# Patient Record
Sex: Male | Born: 1980 | Race: White | Hispanic: No | Marital: Single | Smoking: Current some day smoker
Health system: Southern US, Community
[De-identification: ages and names within clinical notes are randomized; demographics above are authoritative.]

## PROBLEM LIST (undated history)

## (undated) DIAGNOSIS — Z789 Other specified health status: Secondary | ICD-10-CM

## (undated) HISTORY — PX: NO PAST SURGERIES: SHX2092

---

## 2005-10-03 ENCOUNTER — Inpatient Hospital Stay (HOSPITAL_COMMUNITY): Admission: AD | Admit: 2005-10-03 | Discharge: 2005-10-06 | Payer: Self-pay | Admitting: *Deleted

## 2005-10-03 ENCOUNTER — Ambulatory Visit: Payer: Self-pay | Admitting: *Deleted

## 2005-11-19 ENCOUNTER — Ambulatory Visit (HOSPITAL_COMMUNITY): Payer: Self-pay | Admitting: Psychiatry

## 2005-12-22 ENCOUNTER — Ambulatory Visit (HOSPITAL_COMMUNITY): Payer: Self-pay | Admitting: Psychiatry

## 2006-02-05 ENCOUNTER — Ambulatory Visit (HOSPITAL_COMMUNITY): Payer: Self-pay | Admitting: Licensed Clinical Social Worker

## 2006-03-02 ENCOUNTER — Ambulatory Visit (HOSPITAL_COMMUNITY): Payer: Self-pay | Admitting: Psychiatry

## 2013-03-28 ENCOUNTER — Emergency Department (HOSPITAL_COMMUNITY)
Admission: EM | Admit: 2013-03-28 | Discharge: 2013-03-28 | Discharge status: Against Medical Advice | Payer: BC Managed Care – PPO | Attending: Emergency Medicine | Admitting: Emergency Medicine

## 2013-03-28 ENCOUNTER — Ambulatory Visit (HOSPITAL_COMMUNITY)
Admission: AD | Admit: 2013-03-28 | Discharge: 2013-03-28 | Disposition: A | Discharge status: Home / Self Care | Payer: BC Managed Care – PPO | Attending: Psychiatry | Admitting: Psychiatry

## 2013-03-28 ENCOUNTER — Encounter (HOSPITAL_COMMUNITY): Payer: Self-pay | Admitting: Emergency Medicine

## 2013-03-28 ENCOUNTER — Encounter (HOSPITAL_COMMUNITY): Payer: Self-pay | Admitting: Licensed Clinical Social Worker

## 2013-03-28 DIAGNOSIS — F1910 Other psychoactive substance abuse, uncomplicated: Secondary | ICD-10-CM

## 2013-03-28 DIAGNOSIS — F411 Generalized anxiety disorder: Secondary | ICD-10-CM | POA: Insufficient documentation

## 2013-03-28 DIAGNOSIS — F1110 Opioid abuse, uncomplicated: Secondary | ICD-10-CM | POA: Insufficient documentation

## 2013-03-28 DIAGNOSIS — F17200 Nicotine dependence, unspecified, uncomplicated: Secondary | ICD-10-CM | POA: Insufficient documentation

## 2013-03-28 DIAGNOSIS — F1510 Other stimulant abuse, uncomplicated: Secondary | ICD-10-CM | POA: Insufficient documentation

## 2013-03-28 HISTORY — DX: Other specified health status: Z78.9

## 2013-03-28 LAB — COMPREHENSIVE METABOLIC PANEL
AST: 18 U/L (ref 0–37)
Albumin: 4.6 g/dL (ref 3.5–5.2)
Alkaline Phosphatase: 50 U/L (ref 39–117)
Chloride: 102 mEq/L (ref 96–112)
Creatinine, Ser: 0.69 mg/dL (ref 0.50–1.35)
Potassium: 3.6 mEq/L (ref 3.5–5.1)
Total Bilirubin: 0.2 mg/dL — ABNORMAL LOW (ref 0.3–1.2)
Total Protein: 7.9 g/dL (ref 6.0–8.3)

## 2013-03-28 LAB — CBC WITH DIFFERENTIAL/PLATELET
Basophils Absolute: 0 10*3/uL (ref 0.0–0.1)
Basophils Relative: 1 % (ref 0–1)
Eosinophils Relative: 2 % (ref 0–5)
HCT: 48 % (ref 39.0–52.0)
MCHC: 35.4 g/dL (ref 30.0–36.0)
Monocytes Absolute: 0.5 10*3/uL (ref 0.1–1.0)
Neutro Abs: 4.2 10*3/uL (ref 1.7–7.7)
Neutrophils Relative %: 59 % (ref 43–77)
RDW: 12.1 % (ref 11.5–15.5)

## 2013-03-28 LAB — RAPID URINE DRUG SCREEN, HOSP PERFORMED
Amphetamines: POSITIVE — AB
Barbiturates: NOT DETECTED
Benzodiazepines: NOT DETECTED
Cocaine: NOT DETECTED
Opiates: NOT DETECTED
Tetrahydrocannabinol: NOT DETECTED

## 2013-03-28 LAB — SALICYLATE LEVEL: Salicylate Lvl: <2 mg/dL — ABNORMAL LOW (ref 2.8–20.0)
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
## 2013-03-28 NOTE — ED Notes (Signed)
Pt reports that he wants detox from heroin, cocaine, methamphetamines and ETOH.  Pt reports that he has been using these drugs for several years now and he is ready to quit.  Pt was seen at [facility] and needs medical clearance. Denies SI/HI at this time.

## 2013-03-28 NOTE — ED Provider Notes (Signed)
CSN: [id number]     Arrival date & time 03/28/13  2440 History   First MD Initiated Contact with Patient 03/28/13 [phone]     Chief Complaint  Patient presents with  . Medical Clearance   (Consider location/radiation/quality/duration/timing/severity/associated sxs/prior Treatment) HPI Comments: Patient presents requesting detox. He states he drinks all the time. He also admits to using a lot of methamphetamines and snorting heroin. He states he feels like he wants to quit. He [facility] and was sent over here for medical clearance. He currently denies any physical complaints. He denies any withdrawal symptoms. He denies any suicidal or homicidal ideations.   Past Medical History  Diagnosis Date  . Medical history non-contributory    Past Surgical History  Procedure Laterality Date  . No past surgeries     History reviewed. No pertinent family history. History  Substance Use Topics  . Smoking status: Current Every Day Smoker -- 1.00 packs/day    Types: Cigarettes  . Smokeless tobacco: Not on file  . Alcohol Use: 13.2 oz/week    12 Cans of beer, 10 Shots of liquor per week    Review of Systems  Constitutional: Negative for fever, chills, diaphoresis and fatigue.  HENT: Negative for congestion, rhinorrhea and sneezing.   Eyes: Negative.   Respiratory: Negative for cough, chest tightness and shortness of breath.   Cardiovascular: Negative for chest pain and leg swelling.  Gastrointestinal: Negative for nausea, vomiting, abdominal pain, diarrhea and blood in stool.  Genitourinary: Negative for frequency, hematuria, flank pain and difficulty urinating.  Musculoskeletal: Negative for back pain and arthralgias.  Skin: Negative for rash.  Neurological: Negative for dizziness, speech difficulty, weakness, numbness and headaches.  Psychiatric/Behavioral: The patient is nervous/anxious.     Allergies  Review of patient's allergies indicates no known  allergies.  Home Medications  No current outpatient prescriptions on file. BP 146/92  Pulse 119  Temp(Src) 98.3 F (36.8 C) (Oral)  Resp 16  Ht 6' (1.829 m)  Wt 160 lb (72.576 kg)  BMI 21.7 kg/m2  SpO2 98% Physical Exam  Constitutional: He is oriented to person, place, and time. He appears well-developed and well-nourished.  HENT:  Head: Normocephalic and atraumatic.  Eyes: Pupils are equal, round, and reactive to light.  Neck: Normal range of motion. Neck supple.  Cardiovascular: Normal rate, regular rhythm and normal heart sounds.   Pulmonary/Chest: Effort normal and breath sounds normal. No respiratory distress. He has no wheezes. He has no rales. He exhibits no tenderness.  Abdominal: Soft. Bowel sounds are normal. There is no tenderness. There is no rebound and no guarding.  Musculoskeletal: Normal range of motion. He exhibits no edema.  Lymphadenopathy:    He has no cervical adenopathy.  Neurological: He is alert and oriented to person, place, and time.  Skin: Skin is warm and dry. No rash noted.  Psychiatric: He has a normal mood and affect.  Appears slightly anxious.    ED Course  Procedures (including critical care time) Labs Review Results for orders placed during the hospital encounter of 03/28/13  CBC WITH DIFFERENTIAL      Result Value Range   WBC 7.1  4.0 - 10.5 K/uL   RBC 5.16  4.22 - 5.81 MIL/uL   Hemoglobin 17.0  13.0 - 17.0 g/dL   HCT 25.3  66.4 - 40.3 %   MCV 93.0  78.0 - 100.0 fL   MCH 32.9  26.0 - 34.0 pg   MCHC 35.4  30.0 -  36.0 g/dL   RDW 09.8  11.9 - 14.7 %   Platelets 296  150 - 400 K/uL   Neutrophils Relative % 59  43 - 77 %   Neutro Abs 4.2  1.7 - 7.7 K/uL   Lymphocytes Relative 32  12 - 46 %   Lymphs Abs 2.3  0.7 - 4.0 K/uL   Monocytes Relative 7  3 - 12 %   Monocytes Absolute 0.5  0.1 - 1.0 K/uL   Eosinophils Relative 2  0 - 5 %   Eosinophils Absolute 0.2  0.0 - 0.7 K/uL   Basophils Relative 1  0 - 1 %   Basophils Absolute 0.0  0.0 -  0.1 K/uL  COMPREHENSIVE METABOLIC PANEL      Result Value Range   Sodium 141  135 - 145 mEq/L   Potassium 3.6  3.5 - 5.1 mEq/L   Chloride 102  96 - 112 mEq/L   CO2 26  19 - 32 mEq/L   Glucose, Bld 100 (*) 70 - 99 mg/dL   BUN 8  6 - 23 mg/dL   Creatinine, Ser 8.29  0.50 - 1.35 mg/dL   Calcium 9.5  8.4 - 56.2 mg/dL   Total Protein 7.9  6.0 - 8.3 g/dL   Albumin 4.6  3.5 - 5.2 g/dL   AST 18  0 - 37 U/L   ALT 22  0 - 53 U/L   Alkaline Phosphatase 50  39 - 117 U/L   Total Bilirubin 0.2 (*) 0.3 - 1.2 mg/dL   GFR calc non Af Amer >90  >90 mL/min   GFR calc Af Amer >90  >90 mL/min  ETHANOL      Result Value Range   Alcohol, Ethyl (B) 155 (*) 0 - 11 mg/dL  ACETAMINOPHEN LEVEL      Result Value Range   Acetaminophen (Tylenol), Serum <15.0  10 - 30 ug/mL  SALICYLATE LEVEL      Result Value Range   Salicylate Lvl <2.0 (*) 2.8 - 20.0 mg/dL   No results found.   Date: 03/28/2013  Rate: 99  Rhythm: normal sinus rhythm  QRS Axis: normal  Intervals: normal  ST/T Wave abnormalities: normal  Conduction Disutrbances:none  Narrative Interpretation:   Old EKG Reviewed: none available   Imaging Review No results found.  MDM   1. Substance abuse    Will check labs and consult TTS for possible detox placement.  Start CIWA protocol.  Patient ended up leaving AMA prior to TTS evaluation  Rolan Bucco, MD 03/28/13 0630

## 2013-03-28 NOTE — ED Notes (Signed)
Pt stating he does not wish to stay. Dr. Fredderick Phenix made aware and stated pt would have to leave AMA.

## 2013-03-28 NOTE — Consult Note (Signed)
  Results review:Labs  Willliam D.Kuhar acceptable for admission to treatment facility

## 2013-03-28 NOTE — ED Notes (Signed)
Dr. Fredderick Phenix made aware pt left AMA at this time.

## 2013-10-27 ENCOUNTER — Encounter (HOSPITAL_COMMUNITY): Payer: Self-pay | Admitting: Emergency Medicine

## 2013-10-27 ENCOUNTER — Emergency Department (HOSPITAL_COMMUNITY)
Admission: EM | Admit: 2013-10-27 | Discharge: 2013-10-27 | Disposition: A | Discharge status: Home / Self Care | Payer: BC Managed Care – PPO | Attending: Emergency Medicine | Admitting: Emergency Medicine

## 2013-10-27 ENCOUNTER — Emergency Department (HOSPITAL_COMMUNITY): Payer: BC Managed Care – PPO

## 2013-10-27 DIAGNOSIS — T148XXA Other injury of unspecified body region, initial encounter: Secondary | ICD-10-CM | POA: Insufficient documentation

## 2013-10-27 DIAGNOSIS — T07XXXA Unspecified multiple injuries, initial encounter: Secondary | ICD-10-CM

## 2013-10-27 DIAGNOSIS — S40219A Abrasion of unspecified shoulder, initial encounter: Secondary | ICD-10-CM

## 2013-10-27 DIAGNOSIS — S0180XA Unspecified open wound of other part of head, initial encounter: Secondary | ICD-10-CM | POA: Insufficient documentation

## 2013-10-27 DIAGNOSIS — Z792 Long term (current) use of antibiotics: Secondary | ICD-10-CM | POA: Insufficient documentation

## 2013-10-27 DIAGNOSIS — Z791 Long term (current) use of non-steroidal anti-inflammatories (NSAID): Secondary | ICD-10-CM | POA: Insufficient documentation

## 2013-10-27 DIAGNOSIS — S40019A Contusion of unspecified shoulder, initial encounter: Secondary | ICD-10-CM | POA: Insufficient documentation

## 2013-10-27 DIAGNOSIS — S46909A Unspecified injury of unspecified muscle, fascia and tendon at shoulder and upper arm level, unspecified arm, initial encounter: Secondary | ICD-10-CM | POA: Insufficient documentation

## 2013-10-27 DIAGNOSIS — IMO0002 Reserved for concepts with insufficient information to code with codable children: Secondary | ICD-10-CM | POA: Insufficient documentation

## 2013-10-27 DIAGNOSIS — S0190XA Unspecified open wound of unspecified part of head, initial encounter: Secondary | ICD-10-CM | POA: Insufficient documentation

## 2013-10-27 DIAGNOSIS — Z79899 Other long term (current) drug therapy: Secondary | ICD-10-CM | POA: Insufficient documentation

## 2013-10-27 DIAGNOSIS — F17200 Nicotine dependence, unspecified, uncomplicated: Secondary | ICD-10-CM | POA: Insufficient documentation

## 2013-10-27 DIAGNOSIS — S1093XA Contusion of unspecified part of neck, initial encounter: Secondary | ICD-10-CM

## 2013-10-27 DIAGNOSIS — S0083XA Contusion of other part of head, initial encounter: Secondary | ICD-10-CM | POA: Insufficient documentation

## 2013-10-27 DIAGNOSIS — S0003XA Contusion of scalp, initial encounter: Secondary | ICD-10-CM | POA: Insufficient documentation

## 2013-10-27 DIAGNOSIS — S4980XA Other specified injuries of shoulder and upper arm, unspecified arm, initial encounter: Secondary | ICD-10-CM | POA: Insufficient documentation

## 2013-10-27 DIAGNOSIS — S4991XA Unspecified injury of right shoulder and upper arm, initial encounter: Secondary | ICD-10-CM

## 2013-10-27 DIAGNOSIS — Z23 Encounter for immunization: Secondary | ICD-10-CM | POA: Insufficient documentation

## 2013-10-27 MED ORDER — IBUPROFEN 800 MG PO TABS: 800.0000 mg | ORAL_TABLET | Freq: Three times a day (TID) | ORAL | Status: DC

## 2013-10-27 MED ORDER — HYDROCODONE-ACETAMINOPHEN 5-325 MG PO TABS: 1.0000 | ORAL_TABLET | ORAL | Status: DC | PRN

## 2013-10-27 MED ORDER — CEPHALEXIN 500 MG PO CAPS: 500.0000 mg | ORAL_CAPSULE | Freq: Four times a day (QID) | ORAL | Status: DC

## 2013-10-27 MED ORDER — TETANUS-DIPHTH-ACELL PERTUSSIS 5-2.5-18.5 LF-MCG/0.5 IM SUSP
0.5000 mL | Freq: Once | INTRAMUSCULAR | Status: AC
  Administered 2013-10-27: 0.5 mL via INTRAMUSCULAR
  Filled 2013-10-27: qty 0.5

## 2013-10-27 NOTE — Discharge Instructions (Signed)
Call an Orthopedic doctor for further evaluation of your shoulder. Call for a follow up appointment with a Family or Primary Care Provider.  Return if Symptoms worsen.   Take medication as prescribed.  Ice your shoulder and other injuries 3-4 times a day.
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
## 2013-10-27 NOTE — Discharge Instructions (Signed)
Assault, General  Assault includes any behavior, whether intentional or reckless, which results in bodily injury to another person and/or damage to property. Included in this would be any behavior, intentional or reckless, that by its nature would be understood (interpreted) by a reasonable person as intent to harm another person or to damage his/her property. Threats may be oral or written. They may be communicated through regular mail, computer, fax, or phone. These threats may be direct or implied.  FORMS OF ASSAULT INCLUDE:  · Physically assaulting a person. This includes physical threats to inflict physical harm as well as:  · Slapping.  · Hitting.  · Poking.  · Kicking.  · Punching.  · Pushing.  · Arson.  · Sabotage.  · Equipment vandalism.  · Damaging or destroying property.  · Throwing or hitting objects.  · Displaying a weapon or an object that appears to be a weapon in a threatening manner.  · Carrying a firearm of any kind.  · Using a weapon to harm someone.  · Using greater physical size/strength to intimidate another.  · Making intimidating or threatening gestures.  · Bullying.  · Hazing.  · Intimidating, threatening, hostile, or abusive language directed toward another person.  · It communicates the intention to engage in violence against that person. And it leads a reasonable person to expect that violent behavior may occur.  · Stalking another person.  IF IT HAPPENS AGAIN:  · Immediately call for emergency help (911 in [place]  · If someone poses clear and immediate danger to you, seek legal authorities to have a protective or restraining order put in place.  · Less threatening assaults can at least be reported to authorities.  STEPS TO TAKE IF A SEXUAL ASSAULT HAS HAPPENED  · Go to an area of safety. This may include a shelter or staying with a friend. Stay away from the area where you have been attacked. A large percentage of sexual assaults are caused by a friend, relative or associate.  · If  medications were given by your caregiver, take them as directed for the full length of time prescribed.  · Only take over-the-counter or prescription medicines for pain, discomfort, or fever as directed by your caregiver.  · If you have come in contact with a sexual disease, find out if you are to be tested again. If your caregiver is concerned about the HIV/AIDS virus, he/she may require you to have continued testing for several months.  · For the protection of your privacy, test results can not be given over the phone. Make sure you receive the results of your test. If your test results are not back during your visit, make an appointment with your caregiver to find out the results. Do not assume everything is normal if you have not heard from your caregiver or the medical facility. It is important for you to follow up on all of your test results.  · File appropriate papers with authorities. This is important in all assaults, even if it has occurred in a family or by a friend.  SEEK MEDICAL CARE IF:  · You have new problems because of your injuries.  · You have problems that may be because of the medicine you are taking, such as:  · Rash.  · Itching.  · Swelling.  · Trouble breathing.  · You develop belly (abdominal) pain, feel sick to your stomach (nausea) or are vomiting.  · You begin to run a temperature.  · You   need supportive care or referral to a rape crisis center. These are centers with trained personnel who can help you get through this ordeal.  SEEK IMMEDIATE MEDICAL CARE IF:  · You are afraid of being threatened, beaten, or abused. In [place] call 911.  · You receive new injuries related to abuse.  · You develop severe pain in any area injured in the assault or have any change in your condition that concerns you.  · You faint or lose consciousness.  · You develop chest pain or shortness of breath.  Document Released: 06/09/2005 Document Revised: 09/01/2011 Document Reviewed: 01/26/2008  Exit[employer]® Patient  Information ©2014 [employer].

## 2013-10-27 NOTE — ED Notes (Signed)
PA at the bedside.
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
## 2013-10-28 NOTE — ED Provider Notes (Signed)
Medical screening examination/treatment/procedure(s) were performed by non-physician practitioner and as supervising physician I was immediately available for consultation/collaboration.   EKG Interpretation None        Tykeisha Peer M Ethridge Sollenberger, DO 10/28/13 2111 

## 2013-11-01 ENCOUNTER — Encounter (HOSPITAL_COMMUNITY): Payer: Self-pay | Admitting: Emergency Medicine

## 2013-11-01 ENCOUNTER — Emergency Department (INDEPENDENT_AMBULATORY_CARE_PROVIDER_SITE_OTHER)
Admission: EM | Admit: 2013-11-01 | Discharge: 2013-11-01 | Disposition: A | Discharge status: Home / Self Care | Payer: Self-pay | Source: Home / Self Care | Attending: Family Medicine | Admitting: Family Medicine

## 2013-11-01 DIAGNOSIS — Z4802 Encounter for removal of sutures: Secondary | ICD-10-CM

## 2013-11-01 NOTE — Discharge Instructions (Signed)
Care as discussed, return for suture removal.

## 2013-11-01 NOTE — ED Provider Notes (Signed)
CSN: [id number]     Arrival date & time 11/01/13  0831 History   First MD Initiated Contact with Patient 11/01/13 0915     Chief Complaint  Patient presents with  . Facial Laceration   (Consider location/radiation/quality/duration/timing/severity/associated sxs/prior Treatment) Patient is a 33 y.o. male presenting with suture removal. The history is provided by the patient.  Suture / Staple Removal This is a new problem. The current episode started more than 2 days ago (right eyebrow and chin sutured 5/7 in ER, thick eschar over both wounds present.).    Past Medical History  Diagnosis Date  . Medical history non-contributory    Past Surgical History  Procedure Laterality Date  . No past surgeries     History reviewed. No pertinent family history. History  Substance Use Topics  . Smoking status: Current Every Day Smoker -- 1.00 packs/day    Types: Cigarettes  . Smokeless tobacco: Not on file  . Alcohol Use: 13.2 oz/week    12 Cans of beer, 10 Shots of liquor per week    Review of Systems  Constitutional: Negative.   Skin: Positive for wound.    Allergies  Review of patient's allergies indicates no known allergies.  Home Medications   Prior to Admission medications   Medication Sig Start Date End Date Taking? Authorizing Provider  esomeprazole (NEXIUM) 40 MG capsule Take 40 mg by mouth daily at 12 noon.    Historical Provider, MD  HYDROcodone-acetaminophen (NORCO/VICODIN) 5-325 MG per tablet Take 1 tablet by mouth every 4 (four) hours as needed. 10/27/13   Lauren Doretha ImusM Parker, PA-C   BP 132/88  Pulse 102  Temp(Src) 98.5 F (36.9 C) (Oral)  Resp 16  SpO2 98% Physical Exam  Nursing note and vitals reviewed. Constitutional: He is oriented to person, place, and time. He appears well-developed and well-nourished.  Neurological: He is alert and oriented to person, place, and time.  Skin: Skin is warm and dry.  sutres removed from chin lac, bacitracin oint applied, unable  to remove eyebrow sutures b/o eschar.    ED Course  Procedures (including critical care time) Labs Review Labs Reviewed - No data to display  Imaging Review No results found.   MDM   1. Encounter for removal of sutures    Eschar needs to be removed before sutures removed or wound will separate at this time.    Linna HoffJames D Kindl, MD 11/01/13 [phone]

## 2013-11-01 NOTE — ED Notes (Signed)
Pt   Reports     He  Is  Here  For  Suture  Removal on  His  Face   Sutures  Have  Been in place  For    5  Days

## 2013-11-04 ENCOUNTER — Encounter (HOSPITAL_COMMUNITY): Payer: Self-pay | Admitting: Emergency Medicine

## 2013-11-04 ENCOUNTER — Emergency Department (INDEPENDENT_AMBULATORY_CARE_PROVIDER_SITE_OTHER)
Admission: EM | Admit: 2013-11-04 | Discharge: 2013-11-04 | Disposition: A | Discharge status: Home / Self Care | Payer: Self-pay | Source: Home / Self Care

## 2013-11-04 DIAGNOSIS — Z4802 Encounter for removal of sutures: Secondary | ICD-10-CM

## 2013-11-04 NOTE — ED Provider Notes (Signed)
CSN: [id number]     Arrival date & time 11/04/13  [id number] History   First MD Initiated Contact with Patient 11/04/13 [phone]     Chief Complaint  Patient presents with  . Suture / Staple Removal   (Consider location/radiation/quality/duration/timing/severity/associated sxs/prior Treatment) HPI Comments: For suture removal of laceration repaired in ED. Located above R eye.    Past Medical History  Diagnosis Date  . Medical history non-contributory    Past Surgical History  Procedure Laterality Date  . No past surgeries     History reviewed. No pertinent family history. History  Substance Use Topics  . Smoking status: Current Every Day Smoker -- 1.00 packs/day    Types: Cigarettes  . Smokeless tobacco: Not on file  . Alcohol Use: 13.2 oz/week    12 Cans of beer, 10 Shots of liquor per week    Review of Systems  All other systems reviewed and are negative.   Allergies  Review of patient's allergies indicates no known allergies.  Home Medications   Prior to Admission medications   Medication Sig Start Date End Date Taking? Authorizing Provider  esomeprazole (NEXIUM) 40 MG capsule Take 40 mg by mouth daily at 12 noon.    Historical Provider, MD  HYDROcodone-acetaminophen (NORCO/VICODIN) 5-325 MG per tablet Take 1 tablet by mouth every 4 (four) hours as needed. 10/27/13   Lauren Doretha ImusM Parker, PA-C   BP 134/86  Pulse 74  Temp(Src) 98.4 F (36.9 C) (Oral)  Resp 12  SpO2 100% Physical Exam  Nursing note and vitals reviewed. Constitutional: He is oriented to person, place, and time. He appears well-developed and well-nourished. No distress.  Neurological: He is alert and oriented to person, place, and time.  Skin: Skin is warm and dry.  Wound healing well. Edges well approximated. No signs of infection    ED Course  SUTURE REMOVAL Date/Time: 11/04/2013 9:21 AM Performed by: Phineas RealMABE, Saralyn Willison Authorized by: Bradd CanaryKINDL, JAMES D Consent: Verbal consent obtained. Risks and benefits: risks,  benefits and alternatives were discussed Consent given by: patient Patient understanding: patient states understanding of the procedure being performed Patient identity confirmed: verbally with patient Body area: head/neck Location details: right eyebrow Wound Appearance: clean Sutures Removed: 6 Patient tolerance: Patient tolerated the procedure well with no immediate complications.   (including critical care time) Labs Review Labs Reviewed - No data to display  Imaging Review No results found.   MDM   1. Visit for suture removal     All sutures removed. Mild puffiness but no erythema or signs of infection.  Return if needed.    Hayden Rasmussenavid Jazilyn Siegenthaler, NP 11/04/13 [phone]

## 2013-11-04 NOTE — ED Provider Notes (Signed)
Medical screening examination/treatment/procedure(s) were performed by resident physician or non-physician practitioner and as supervising physician I was immediately available for consultation/collaboration.   Nattie Lazenby DOUGLAS MD.   Hazely Sealey D Nadra Hritz, MD 11/04/13 1040 

## 2013-11-04 NOTE — ED Notes (Signed)
Suture removal from right brow.  Pt voices no concerns.

## 2013-11-04 NOTE — Discharge Instructions (Signed)
Suture Removal, Care After Refer to this sheet in the next few weeks. These instructions provide you with information on caring for yourself after your procedure. Your health care provider may also give you more specific instructions. Your treatment has been planned according to current medical practices, but problems sometimes occur. Call your health care provider if you have any problems or questions after your procedure. WHAT TO EXPECT AFTER THE PROCEDURE After your stitches (sutures) are removed, it is typical to have the following:  Some discomfort and swelling in the wound area.  Slight redness in the area. HOME CARE INSTRUCTIONS   If you have skin adhesive strips over the wound area, do not take the strips off. They will fall off on their own in a few days. If the strips remain in place after 14 days, you may remove them.  Change any bandages (dressings) at least once a day or as directed by your health care provider. If the bandage sticks, soak it off with warm, soapy water.  Apply cream or ointment only as directed by your health care provider. If using cream or ointment, wash the area with soap and water 2 times a day to remove all the cream or ointment. Rinse off the soap and pat the area dry with a clean towel.  Keep the wound area dry and clean. If the bandage becomes wet or dirty, or if it develops a bad smell, change it as soon as possible.  Continue to protect the wound from injury.  Use sunscreen when out in the sun. New scars become sunburned easily. SEEK MEDICAL CARE IF:  You have increasing redness, swelling, or pain in the wound.  You see pus coming from the wound.  You have a fever.  You notice a bad smell coming from the wound or dressing.  Your wound breaks open (edges not staying together). Document Released: 03/04/2001 Document Revised: 03/30/2013 Document Reviewed: 01/19/2013 [employer] Patient Information 2014 [employer].  

## 2014-01-16 ENCOUNTER — Encounter (HOSPITAL_COMMUNITY): Payer: Self-pay | Admitting: Emergency Medicine

## 2014-01-16 ENCOUNTER — Emergency Department (HOSPITAL_COMMUNITY)
Admission: EM | Admit: 2014-01-16 | Discharge: 2014-01-17 | Discharge status: Against Medical Advice | Payer: Self-pay | Attending: Emergency Medicine | Admitting: Emergency Medicine

## 2014-01-16 DIAGNOSIS — S61012A Laceration without foreign body of left thumb without damage to nail, initial encounter: Secondary | ICD-10-CM

## 2014-01-16 DIAGNOSIS — W292XXA Contact with other powered household machinery, initial encounter: Secondary | ICD-10-CM | POA: Insufficient documentation

## 2014-01-16 DIAGNOSIS — Y9389 Activity, other specified: Secondary | ICD-10-CM | POA: Insufficient documentation

## 2014-01-16 DIAGNOSIS — S61209A Unspecified open wound of unspecified finger without damage to nail, initial encounter: Secondary | ICD-10-CM | POA: Insufficient documentation

## 2014-01-16 DIAGNOSIS — F17200 Nicotine dependence, unspecified, uncomplicated: Secondary | ICD-10-CM | POA: Insufficient documentation

## 2014-01-16 DIAGNOSIS — Z79899 Other long term (current) drug therapy: Secondary | ICD-10-CM | POA: Insufficient documentation

## 2014-01-16 DIAGNOSIS — Z792 Long term (current) use of antibiotics: Secondary | ICD-10-CM | POA: Insufficient documentation

## 2014-01-16 DIAGNOSIS — Y9289 Other specified places as the place of occurrence of the external cause: Secondary | ICD-10-CM | POA: Insufficient documentation

## 2014-01-16 NOTE — ED Provider Notes (Signed)
CSN: [id number]     Arrival date & time 01/16/14  2217 History  This chart was scribed for non-physician provider Dierdre Forth, PA-C, working with Marisa Severin, MD by Phillis Haggis, ED Scribe. This patient was seen in room TR07C/TR07C and patient care was started at 11:07 PM.    Chief Complaint  Patient presents with  . 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  He said the knife slipped and it caught his thumb on his left hand.    The history is provided by the patient and medical records. No language interpreter was used.   HPI Comments: Justin Daniel is a 33 y.o. male who presents to the Emergency Department complaining of a left thumb laceration onset. 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 He states that he has had stitches before. He states that there was a lot of blood upon the start of the incident, but bleeding is now controlled. He reports that he has had a tdap in the last 6 months. He reports that he is unable to bend his thumb, but denies numbness and tingling.  Past Medical History  Diagnosis Date  . Medical history non-contributory    Past Surgical History  Procedure Laterality Date  . No past surgeries     History reviewed. No pertinent family history. History  Substance Use Topics  . Smoking status: Current Every Day Smoker -- 1.00 packs/day    Types: Cigarettes  . Smokeless tobacco: Not on file  . Alcohol Use: 13.2 oz/week    12 Cans of beer, 10 Shots of liquor per week    Review of Systems  Constitutional: Negative for fever.  Gastrointestinal: Negative for nausea and vomiting.  Skin: Positive for wound.  Allergic/Immunologic: Negative for immunocompromised state.  Neurological: Negative for weakness and numbness.  Hematological: Does not bruise/bleed easily.  Psychiatric/Behavioral: The patient is not nervous/anxious.     Allergies  Review of patient's allergies indicates no known allergies.  Home Medications   Prior to Admission medications   Medication Sig Start Date End Date Taking? Authorizing Provider  esomeprazole (NEXIUM) 40 MG capsule Take 40 mg by mouth daily at 12 noon.   Yes Historical Provider, MD  naphazoline-glycerin (CLEAR EYES) 0.012-0.2 % SOLN Place 1-2 drops into both eyes every 4 (four) hours as needed for irritation.   Yes Historical Provider, MD  cephALEXin (KEFLEX) 500 MG capsule Take 1 capsule (500 mg total) by mouth 4 (four) times daily. 01/17/14   Princella Jaskiewicz, PA-C   BP 136/90  Pulse 115  Temp(Src) 98.2 F (36.8 C) (Oral)  Resp 18  Ht 6' (1.829 m)  Wt 155 lb (70.308 kg)  BMI 21.02 kg/m2  SpO2 97% Physical Exam  Nursing note and vitals reviewed. Constitutional: He is oriented to person, place, and time. He appears well-developed and well-nourished. No distress.  HENT:  Head: Normocephalic and atraumatic.  Eyes: Conjunctivae are normal. No scleral icterus.  Neck: Normal range of motion.  Cardiovascular: Normal rate, regular rhythm, normal heart sounds and intact distal pulses.   No murmur heard. Capillary refill < 3 sec in the thumb  Pulmonary/Chest: Effort normal and breath sounds normal. No respiratory distress.  Musculoskeletal: Normal range of motion. He exhibits no edema.  ROM: full extension but decreased passive and active flexion Pt with significant pain to palpation of the  proximal phalange of the left thumb Crepitus of the proximal phalange with movement  Neurological: He is alert and oriented to person, place, and time.  Sensation: intact to sharp in the radial and ulnar distributions Strength: 3/5 with extension of the thumb; 1/5 with flexion  Skin: Skin is warm and dry. He is not diaphoretic.  3.5cm laceration to the dorsal and lateral aspect of the thumb  Psychiatric: He has a normal mood and affect.    ED Course  LACERATION REPAIR Date/Time:  01/17/2014 12:07 AM Performed by: Dierdre ForthMUTHERSBAUGH, Meeya Goldin Authorized by: Dierdre ForthMUTHERSBAUGH, Sheri Gatchel Consent: Verbal consent obtained. Risks and benefits: risks, benefits and alternatives were discussed Consent given by: patient Patient understanding: patient states understanding of the procedure being performed Patient consent: the patient's understanding of the procedure matches consent given Procedure consent: procedure consent matches procedure scheduled Relevant documents: relevant documents present and verified Site marked: the operative site was marked Required items: required blood products, implants, devices, and special equipment available Patient identity confirmed: verbally with patient and arm band Time out: Immediately prior to procedure a "time out" was called to verify the correct patient, procedure, equipment, support staff and site/side marked as required. Body area: upper extremity Location details: left thumb Laceration length: 3.5 cm Foreign bodies: no foreign bodies Tendon involvement: unknown. Nerve involvement: none Vascular damage: yes Anesthesia: digital block Local anesthetic: lidocaine 2% without epinephrine Anesthetic total: 5 ml Patient sedated: no Preparation: Patient was prepped and draped in the usual sterile fashion. Irrigation solution: saline Irrigation method: syringe Amount of cleaning: extensive Debridement: none Degree of undermining: none Skin closure: 4-0 Prolene Number of sutures: 4 Technique: simple Approximation: close Approximation difficulty: simple Dressing: 4x4 sterile gauze Patient tolerance: Patient tolerated the procedure well with no immediate complications.   (including critical care time) DIAGNOSTIC STUDIES: Oxygen Saturation is 97% on room air, normal by my interpretation.    COORDINATION OF CARE: 11:08 PM-Discussed treatment plan which includes nerve block for further evaluation  11:41 PM- Discussed treatment plan which  includes hand x-ray with pt at bedside Pt reports that he will not consent to x-ray until the stitches are placed.      Labs Review Labs Reviewed - No data to display  Imaging Review No results found.   EKG Interpretation None      MDM   Final diagnoses:  Laceration of thumb, left, complicated, initial encounter   Justin GammaWilliam D Daniel presents with laceration to the left thumb with a butcher knife. Pt with crepitus to the left thumb and significantly restricted flexion leaving concern for possible fracture or tendon damage.  Discussed plan with patient which included x-ray, possible hand consult and sutures.  He reports that he is unwilling to proceed with further evaluation until his sutures are placed.  Will suture wound against my advice and obtain x-ray.    12:13 AM Pt is now refusing his x-ray.  I have discussed at length my concerns about the possibility of a fracture in addition to the possibility of tendon damage.  He reports that he understands this and understands that without a full evaluation of his thumb in the ED, I cannot guarantee that he will regain the use of his thumb.    I have requested that he follow-up with Dr. Mina MarbleWeingold for further evaluation and treatment of the thumb.  As I am suspicious of a fracture, we will splint the thumb and pt will leave AMA.    The patient was discussed with Dr. Gerhard Munchobert Lockwood who  agrees with the treatment plan.   Date: 01/17/2014 Patient: Justin Daniel Admitted: 01/16/2014 10:23 PM Attending Provider: Gerhard Munch, MD  Justin Daniel or his authorized caregiver has made the decision for the patient to leave the emergency department against the advice of [facility], PA-C.  He or his authorized caregiver has been informed and understands the inherent risks, including death.  He or his authorized caregiver has decided to accept the responsibility for this decision. Justin Daniel and all necessary parties have been  advised that he may return for further evaluation or treatment. His condition at time of discharge was Stable.  Justin Daniel had current vital signs as follows:  Blood pressure 136/90, pulse 115, temperature 98.2 F (36.8 C), temperature source Oral, resp. rate 18, height 6' (1.829 m), weight 155 lb (70.308 kg), SpO2 97.00%.   Justin Daniel or his authorized caregiver has signed the Leaving Against Medical Advice form prior to leaving the department.  Rucha Wissinger, Dahlia Client 01/17/2014 12:19 AM  I personally performed the services described in this documentation, which was scribed in my presence. The recorded information has been reviewed and is accurate.      Dahlia Client Michiel Sivley, PA-C 01/17/14 0020

## 2014-01-16 NOTE — ED Notes (Signed)
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  He said the knife slipped and it caught his thumb on his left hand.  He rates his pain 2/10.  Bleeding is controlled.

## 2014-01-17 MED ORDER — CEPHALEXIN 500 MG PO CAPS
500.0000 mg | ORAL_CAPSULE | Freq: Four times a day (QID) | ORAL | Status: DC
Start: 2014-01-17 — End: 2015-01-20

## 2014-01-17 NOTE — ED Provider Notes (Signed)
  Medical screening examination/treatment/procedure(s) were performed by non-physician practitioner and as supervising physician I was immediately available for consultation/collaboration.   EKG Interpretation None         Gerhard Munchobert Kalan Yeley, MD 01/17/14 [phone]

## 2014-01-17 NOTE — ED Notes (Signed)
Patient left with all personal belongings and was refusing to have vitals taken.

## 2014-01-17 NOTE — Discharge Instructions (Signed)
1. Medications: keflex, usual home medications 2. Treatment: rest, drink plenty of fluids, keep wound clean with warm soap and water 3. Follow Up: Please followup with Dr. Mina MarbleWeingold this week as able for wound check; have sutures removed in 7 days    Sutured Wound Care Sutures are stitches that can be used to close wounds. Wound care helps prevent pain and infection.  HOME CARE INSTRUCTIONS   Rest and elevate the injured area until all the pain and swelling are gone.  Only take over-the-counter or prescription medicines for pain, discomfort, or fever as directed by your caregiver.  After 48 hours, gently wash the area with mild soap and water once a day, or as directed. Rinse off the soap. Pat the area dry with a clean towel. Do not rub the wound. This may cause bleeding.  Follow your caregiver's instructions for how often to change the bandage (dressing). Stop using a dressing after 2 days or after the wound stops draining.  If the dressing sticks, moisten it with soapy water and gently remove it.  Apply ointment on the wound as directed.  Avoid stretching a sutured wound.  Drink enough fluids to keep your urine clear or pale yellow.  Follow up with your caregiver for suture removal as directed.  Use sunscreen on your wound for the next 3 to 6 months so the scar will not darken. SEEK IMMEDIATE MEDICAL CARE IF:   Your wound becomes red, swollen, hot, or tender.  You have increasing pain in the wound.  You have a red streak that extends from the wound.  There is pus coming from the wound.  You have a fever.  You have shaking chills.  There is a bad smell coming from the wound.  You have persistent bleeding from the wound. MAKE SURE YOU:   Understand these instructions.  Will watch your condition.  Will get help right away if you are not doing well or get worse. Document Released: 07/17/2004 Document Revised: 09/01/2011 Document Reviewed: 10/13/2010 [facility] Patient  Information 2015 [employer] [place]. This information is not intended to replace advice given to you by your health care provider. Make sure you discuss any questions you have with your health care provider.

## 2014-08-28 IMAGING — CR DG SHOULDER 2+V*R*
2 series · 2 of 2 positions shown · non-contrast
Comparison: None.

CLINICAL DATA: 32-year-old male status post injury with pain.
Initial encounter.

EXAM:
RIGHT SHOULDER - 2+ VIEW

[w shoulder ap internal righ]
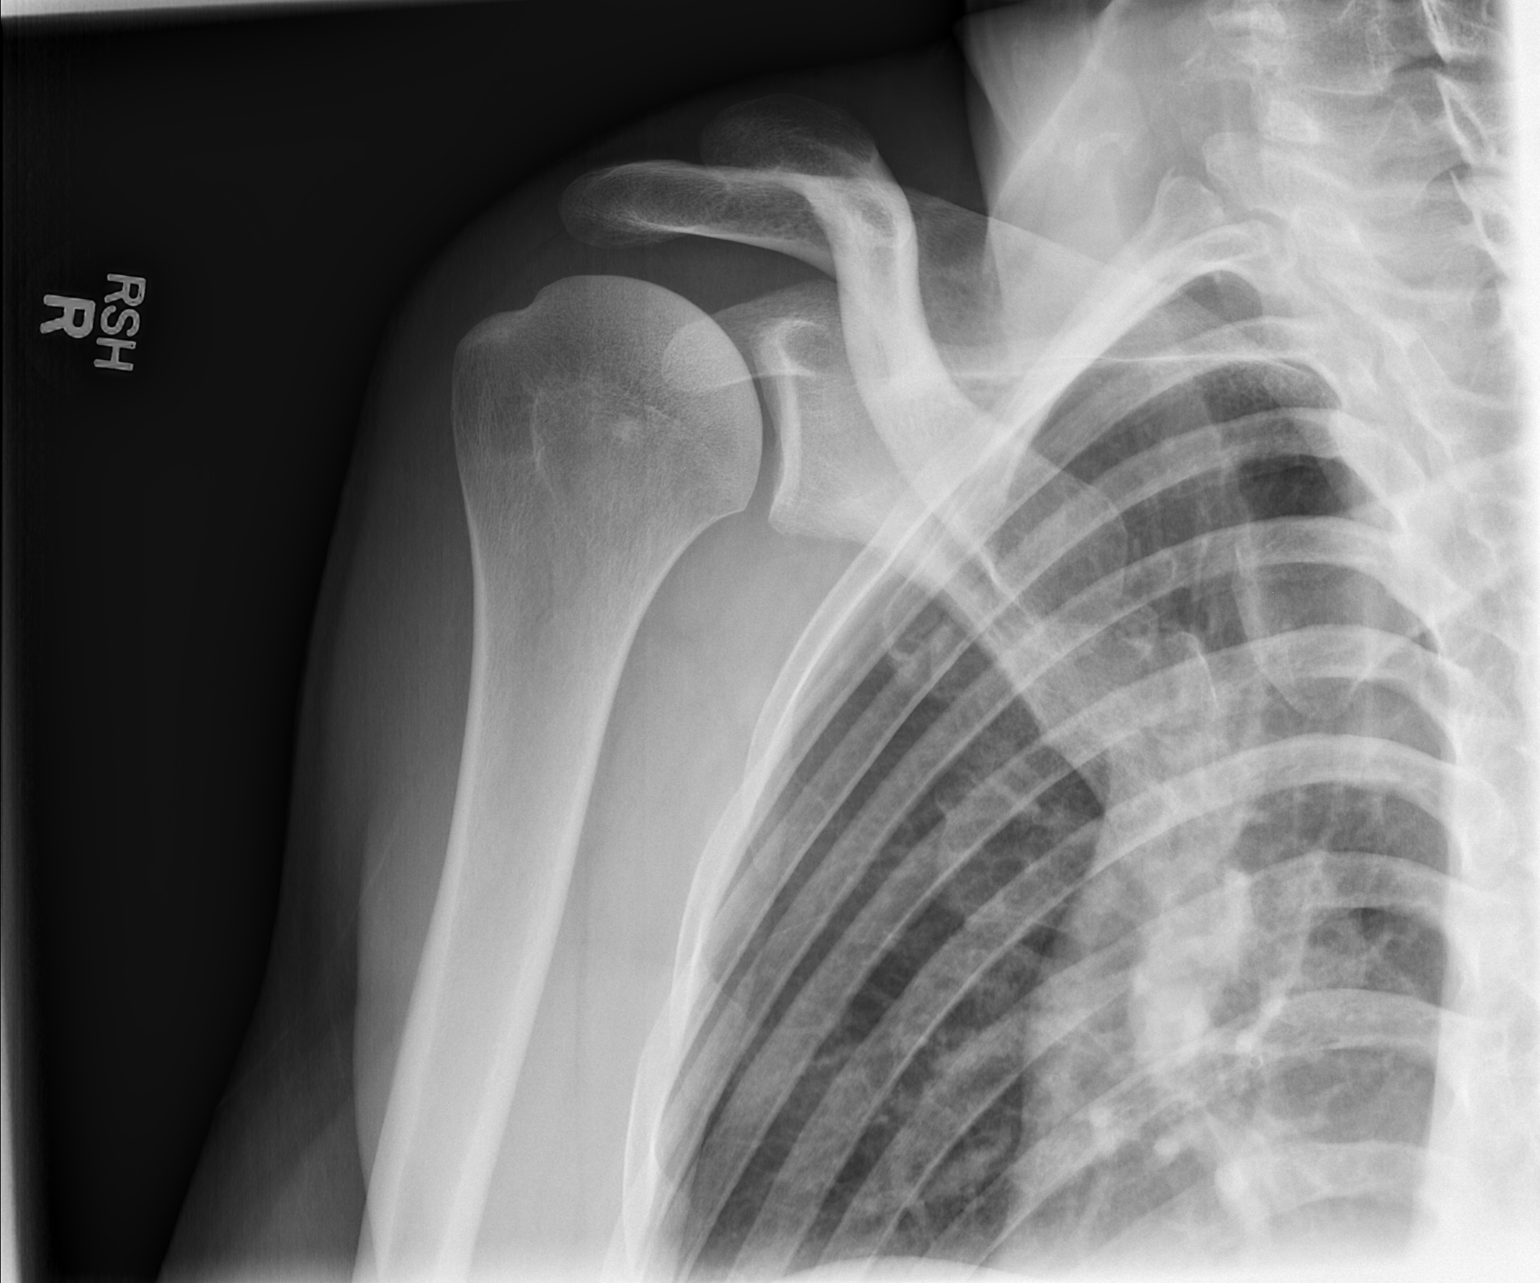

[w shoulder y view right]
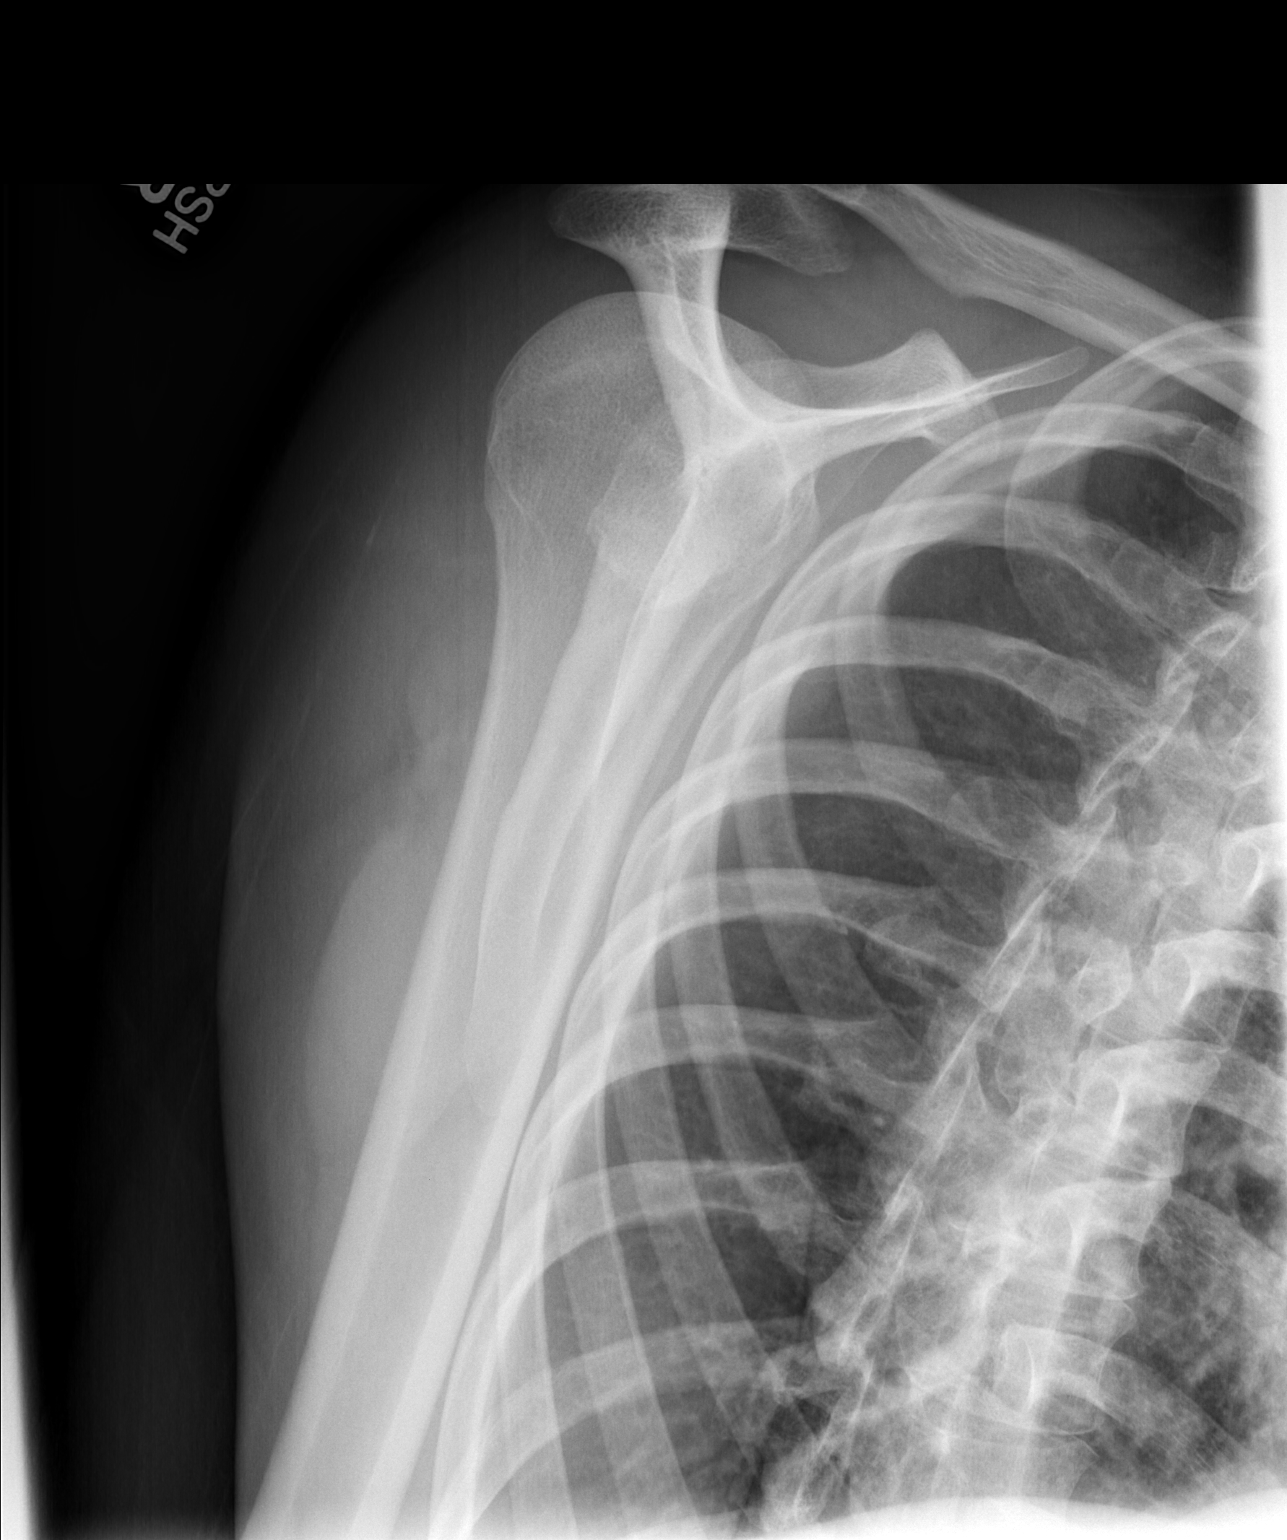

[2 of 2 positions shown; findings below may reference images not displayed]

FINDINGS: Two views of the right shoulder. Bone mineralization is within
normal limits. No glenohumeral joint dislocation. Right clavicle,
proximal humerus and scapula appear intact. Negative visualized
right ribs and lung parenchyma.
IMPRESSION: Negative.

## 2014-10-03 ENCOUNTER — Encounter (HOSPITAL_COMMUNITY): Payer: Self-pay | Admitting: Emergency Medicine

## 2014-10-03 ENCOUNTER — Emergency Department (HOSPITAL_COMMUNITY): Payer: Self-pay

## 2014-10-03 ENCOUNTER — Emergency Department (HOSPITAL_COMMUNITY)
Admission: EM | Admit: 2014-10-03 | Discharge: 2014-10-03 | Disposition: A | Discharge status: Home / Self Care | Payer: Self-pay | Attending: Emergency Medicine | Admitting: Emergency Medicine

## 2014-10-03 DIAGNOSIS — Z720 Tobacco use: Secondary | ICD-10-CM | POA: Insufficient documentation

## 2014-10-03 DIAGNOSIS — G8921 Chronic pain due to trauma: Secondary | ICD-10-CM | POA: Insufficient documentation

## 2014-10-03 DIAGNOSIS — M25511 Pain in right shoulder: Secondary | ICD-10-CM | POA: Insufficient documentation

## 2014-10-03 DIAGNOSIS — Z792 Long term (current) use of antibiotics: Secondary | ICD-10-CM | POA: Insufficient documentation

## 2014-10-03 MED ORDER — TRAMADOL HCL 50 MG PO TABS: 50.0000 mg | ORAL_TABLET | Freq: Four times a day (QID) | ORAL | Status: AC | PRN
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
## 2014-10-03 NOTE — ED Notes (Signed)
Patient transported to X-ray 

## 2014-10-03 NOTE — Discharge Instructions (Signed)
Shoulder Pain °The shoulder is the joint that connects your arms to your body. The bones that form the shoulder joint include the upper arm bone (humerus), the shoulder blade (scapula), and the collarbone (clavicle). The top of the humerus is shaped like a ball and fits into a rather flat socket on the scapula (glenoid cavity). A combination of muscles and strong, fibrous tissues that connect muscles to bones (tendons) support your shoulder joint and hold the ball in the socket. Small, fluid-filled sacs (bursae) are located in different areas of the joint. They act as cushions between the bones and the overlying soft tissues and help reduce friction between the gliding tendons and the bone as you move your arm. Your shoulder joint allows a wide range of motion in your arm. This range of motion allows you to do things like scratch your back or throw a ball. However, this range of motion also makes your shoulder more prone to pain from overuse and injury. °Causes of shoulder pain can originate from both injury and overuse and usually can be grouped in the following four categories: °· Redness, swelling, and pain (inflammation) of the tendon (tendinitis) or the bursae (bursitis). °· Instability, such as a dislocation of the joint. °· Inflammation of the joint (arthritis). °· Broken bone (fracture). °HOME CARE INSTRUCTIONS  °· Apply ice to the sore area. °· Put ice in a plastic bag. °· Place a towel between your skin and the bag. °· Leave the ice on for 15-20 minutes, 3-4 times per day for the first 2 days, or as directed by your health care provider. °· Stop using cold packs if they do not help with the pain. °· If you have a shoulder sling or immobilizer, wear it as long as your caregiver instructs. Only remove it to shower or bathe. Move your arm as little as possible, but keep your hand moving to prevent swelling. °· Squeeze a soft ball or foam pad as much as possible to help prevent swelling. °· Only take  over-the-counter or prescription medicines for pain, discomfort, or fever as directed by your caregiver. °SEEK MEDICAL CARE IF:  °· Your shoulder pain increases, or new pain develops in your arm, hand, or fingers. °· Your hand or fingers become cold and numb. °· Your pain is not relieved with medicines. °SEEK IMMEDIATE MEDICAL CARE IF:  °· Your arm, hand, or fingers are numb or tingling. °· Your arm, hand, or fingers are significantly swollen or turn white or blue. °MAKE SURE YOU:  °· Understand these instructions. °· Will watch your condition. °· Will get help right away if you are not doing well or get worse. °Document Released: 03/19/2005 Document Revised: 10/24/2013 Document Reviewed: 05/24/2011 °Exit[employer]® Patient Information ©2015 [employer]. This information is not intended to replace advice given to you by your health care provider. Make sure you discuss any questions you have with your health care provider. °Shoulder Range of Motion Exercises °The shoulder is the most flexible joint in the human body. Because of this it is also the most unstable joint in the body. All ages can develop shoulder problems. Early treatment of problems is necessary for a good outcome. People react to shoulder pain by decreasing the movement of the joint. After a brief period of time, the shoulder can become "frozen". This is an almost complete loss of the ability to move the damaged shoulder. Following injuries your caregivers can give you instructions on exercises to keep your range of motion (ability to   move your shoulder freely), or regain it if it has been lost.  °EXERCISES °EXERCISES TO MAINTAIN THE MOBILITY OF YOUR SHOULDER: °Codman's Exercise or Pendulum Exercise °· This exercise may be performed in a prone (face-down) lying position or standing while leaning on a chair with the opposite arm. Its purpose is to relax the muscles in your shoulder and slowly but surely increase the range of motion and to relieve  pain. °¨ Lie on your stomach close to the side edge of the bed. Let your weak arm hang over the edge of the bed. Relax your shoulder, arm and hand. Let your shoulder blade relax and drop down. °¨ Slowly and gently swing your arm forward and back. Do not use your neck muscles; relax them. It might be easier to have someone else gently start swinging your arm. °¨ As pain decreases, increase your swing. To start, arm swing should begin at 15 degree angles. In time and as pain lessens, move to 30-45 degree angles. Start with swinging for about 15 seconds, and work towards swinging for 3 to 5 minutes. °· This exercise may also be performed in a standing/bent over position. °¨ Stand and hold onto a sturdy chair with your good arm. Bend forward at the waist and bend your knees slightly to help protect your back. Relax your weak arm, let it hang limp. Relax your shoulder blade and let it drop. °¨ Keep your shoulder relaxed and use body motion to swing your arm in small circles. °¨ Stand up tall and relax. °¨ Repeat motion and change direction of circles. °¨ Start with swinging for about 30 seconds, and work towards swinging for 3 to 5 minutes. °STRETCHING EXERCISES: °· Lift your arm out in front of you with the elbow bent at 90 degrees. Using your other arm gently pull the elbow forward and across your body. °· Bend one arm behind you with the palm facing outward. Using the other arm, hold a towel or rope and reach this arm up above your head, then bend it at the elbow to move your wrist to behind your neck. Grab the free end of the towel with the hand behind your back. Gently pull the towel up with the hand behind your neck, gradually increasing the pull on the hand behind the small of your back. Then, gradually pull down with the hand behind the small of your back. This will pull the hand and arm behind your neck further. Both shoulders will have an increased range of motion with repetition of this  exercise. °STRENGTHENING EXERCISES: °· Standing with your arm at your side and straight out from your shoulder with the elbow bent at 90 degrees, hold onto a small weight and slowly raise your hand so it points straight up in the air. Repeat this five times to begin with, and gradually increase to ten times. Do this four times per day. As you grow stronger you can gradually increase the weight. °· Repeat the above exercise, only this time using an elastic band. Start with your hand up in the air and pull down until your hand is by your side. As you grow stronger, gradually increase the amount you pull by increasing the number or size of the elastic bands. Use the same amount of repetitions. °· Standing with your hand at your side and holding onto a weight, gradually lift the hand in front of you until it is over your head. Do the same also with the hand remaining at   your side and lift the hand away from your body until it is again over your head. Repeat this five times to begin with, and gradually increase to ten times. Do this four times per day. As you grow stronger you can gradually increase the weight. °Document Released: 03/08/2003 Document Revised: 06/14/2013 Document Reviewed: 06/09/2005 °ExitCare® Patient Information ©2015 [employer]. This information is not intended to replace advice given to you by your health care provider. Make sure you discuss any questions you have with your health care provider. ° °

## 2014-10-03 NOTE — ED Notes (Addendum)
Awake. Verbally responsive. A/O x4. Resp even and unlabored. No audible adventitious breath sounds noted. ABC's intact. Pt reported rt shoulder pain with arm in sling. Pt reported doing heavy lifting while at work. (+)PMS, CRT brisk, no deformity/bruising noted, (+)LROM, (+) slight swelling. Family at bedside.

## 2015-01-20 ENCOUNTER — Encounter (HOSPITAL_COMMUNITY): Payer: Self-pay | Admitting: *Deleted

## 2015-01-20 ENCOUNTER — Emergency Department (HOSPITAL_COMMUNITY)
Admission: EM | Admit: 2015-01-20 | Discharge: 2015-01-20 | Disposition: A | Discharge status: Home / Self Care | Payer: Self-pay | Attending: Emergency Medicine | Admitting: Emergency Medicine

## 2015-01-20 DIAGNOSIS — Z720 Tobacco use: Secondary | ICD-10-CM | POA: Insufficient documentation

## 2015-01-20 DIAGNOSIS — L259 Unspecified contact dermatitis, unspecified cause: Secondary | ICD-10-CM | POA: Insufficient documentation

## 2015-01-20 MED ORDER — PREDNISONE 50 MG PO TABS: 50.0000 mg | ORAL_TABLET | Freq: Every day | ORAL | Status: AC

## 2015-01-20 MED ORDER — CEPHALEXIN 500 MG PO CAPS: 500.0000 mg | ORAL_CAPSULE | Freq: Three times a day (TID) | ORAL | Status: AC

## 2015-01-20 NOTE — ED Provider Notes (Signed)
CSN: [id number]     Arrival date & time 01/20/15  1133 History  This chart was scribed for non-physician practitioner Jaynie Crumble, PA-C, working with Gerhard Munch, MD, by Tanda Rockers, ED Scribe. This patient was seen in room WTR9/WTR9 and the patient's care was started at 12:04 PM.  Chief Complaint  Patient presents with  . Rash   The history is provided by the patient. No language interpreter was used.     HPI Comments: Justin Daniel is a 34 y.o. male who presents to the Emergency Department complaining of itchy rash from poison ivy on lower extremities x 2 weeks, worsening last 2 days. Pt also complains of small amount of rash to upper extremities. He admits to scratching the areas. He notes yellow drainage to the rash as well. Pt has been applying calamine lotion without relief. He has not taken any OTC medications. Denies fever, chills, or any other associated symptoms.   Past Medical History  Diagnosis Date  . Medical history non-contributory    Past Surgical History  Procedure Laterality Date  . No past surgeries     No family history on file. History  Substance Use Topics  . Smoking status: Current Some Day Smoker -- 0.25 packs/day    Types: Cigarettes  . Smokeless tobacco: Not on file  . Alcohol Use: 13.2 oz/week    12 Cans of beer, 10 Shots of liquor per week     Comment: several times weekly    Review of Systems  Constitutional: Negative for fever and chills.  Skin: Positive for rash.   Allergies  Review of patient's allergies indicates no known allergies.  Home Medications   Prior to Admission medications   Medication Sig Start Date End Date Taking? Authorizing Provider  cephALEXin (KEFLEX) 500 MG capsule Take 1 capsule (500 mg total) by mouth 4 (four) times daily. Patient not taking: Reported on 10/03/2014 01/17/14   Dahlia Client Muthersbaugh, PA-C  ibuprofen (ADVIL,MOTRIN) 200 MG tablet Take 600 mg by mouth every 6 (six) hours as needed for moderate  pain.    Historical Provider, MD  naphazoline-glycerin (CLEAR EYES) 0.012-0.2 % SOLN Place 1-2 drops into both eyes every 4 (four) hours as needed for irritation.    Historical Provider, MD  traMADol (ULTRAM) 50 MG tablet Take 1 tablet (50 mg total) by mouth every 6 (six) hours as needed. 10/03/14   Tilden Fossa, MD   Triage Vitals: BP 146/88 mmHg  Pulse 100  Temp(Src) 98.3 F (36.8 C) (Oral)  Resp 15  SpO2 100%   Physical Exam  Constitutional: He is oriented to person, place, and time. He appears well-developed and well-nourished. No distress.  HENT:  Head: Normocephalic and atraumatic.  Eyes: Conjunctivae and EOM are normal.  Neck: Neck supple. No tracheal deviation present.  Cardiovascular: Normal rate.   Pulmonary/Chest: Effort normal. No respiratory distress.  Musculoskeletal: Normal range of motion.  Neurological: He is alert and oriented to person, place, and time.  Skin: Skin is warm and dry.  Erythematous vesicular rash to the left lower medial ankle measuring about 5cm diameter. Rash to the left upper anterior shin in linear patter. No ttp. Not warm. No drainage at this time.   Psychiatric: He has a normal mood and affect. His behavior is normal.  Nursing note and vitals reviewed.   ED Course  Procedures (including critical care time)  DIAGNOSTIC STUDIES: Oxygen Saturation is 100% on RA, normal by my interpretation.    COORDINATION OF CARE: 12:06 PM-Discussed  treatment plan which includes Rx prednisone and antibiotics with pt at bedside and pt agreed to plan.   Labs Review Labs Reviewed - No data to display  Imaging Review No results found.   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 He is afebrile, and rashes nontender, patient's concern it is infected. I will treated with prednisone, Topical antibiotics, Benadryl, I gave a prescription for Keflex but  instructed not to take unless the redness, swelling, tenderness becomes worse. Patient worsened or standing.  Filed Vitals:   01/20/15 1139  BP: 146/88  Pulse: 100  Temp: 98.3 F (36.8 C)  TempSrc: Oral  Resp: 15  SpO2: 100%     Jaynie Crumble, PA-C 01/20/15 1244  Gerhard Munch, MD 01/20/15 [phone]

## 2015-01-20 NOTE — Discharge Instructions (Signed)
Apply bacitracin or antibiotic ointment twice a day. Keep clean and dry. You can also apply hydrocortisone ointment. Take prednisone as prescribed until all gone. Bendaryl for itching as needed. If becomes more red, painful, swollen, take keflex as prescribed until all gone. Follow up as needed  Poison [facility] ivy is a inflammation of the skin (contact dermatitis) caused by touching the allergens on the leaves of the ivy plant following previous exposure to the plant. The rash usually appears 48 hours after exposure. The rash is usually bumps (papules) or blisters (vesicles) in a linear pattern. Depending on your own sensitivity, the rash may simply cause redness and itching, or it may also progress to blisters which may break open. These must be well cared for to prevent secondary bacterial (germ) infection, followed by scarring. Keep any open areas dry, clean, dressed, and covered with an antibacterial ointment if needed. The eyes may also get puffy. The puffiness is worst in the morning and gets better as the day progresses. This dermatitis usually heals without scarring, within 2 to 3 weeks without treatment. HOME CARE INSTRUCTIONS  Thoroughly wash with soap and water as soon as you have been exposed to poison ivy. You have about one half hour to remove the plant resin before it will cause the rash. This washing will destroy the oil or antigen on the skin that is causing, or will cause, the rash. Be sure to wash under your fingernails as any plant resin there will continue to spread the rash. Do not rub skin vigorously when washing affected area. Poison ivy cannot spread if no oil from the plant remains on your body. A rash that has progressed to weeping sores will not spread the rash unless you have not washed thoroughly. It is also important to wash any clothes you have been wearing as these may carry active allergens. The rash will return if you wear the unwashed clothing, even several days  later. Avoidance of the plant in the future is the best measure. Poison ivy plant can be recognized by the number of leaves. Generally, poison ivy has three leaves with flowering branches on a single stem. Diphenhydramine may be purchased over the counter and used as needed for itching. Do not drive with this medication if it makes you drowsy.Ask your caregiver about medication for children. SEEK MEDICAL CARE IF:  Open sores develop.  Redness spreads beyond area of rash.  You notice purulent (pus-like) discharge.  You have increased pain.  Other signs of infection develop (such as fever). Document Released: 06/06/2000 Document Revised: 09/01/2011 Document Reviewed: 11/17/2008 [facility] Patient Information 2015 [place], [place]. This information is not intended to replace advice given to you by your health care provider. Make sure you discuss any questions you have with your health care provider.

## 2015-01-20 NOTE — ED Notes (Signed)
Pt reports red, itchy rash that he contributes to poison oak/ivy x2 weeks. Progressively worsening, using calamine lotion with little relief.

## 2015-02-09 ENCOUNTER — Emergency Department (HOSPITAL_COMMUNITY)
Admission: EM | Admit: 2015-02-09 | Discharge: 2015-02-09 | Disposition: A | Discharge status: Home / Self Care | Payer: Self-pay | Attending: Emergency Medicine | Admitting: Emergency Medicine

## 2015-02-09 ENCOUNTER — Emergency Department (HOSPITAL_COMMUNITY): Payer: Self-pay

## 2015-02-09 ENCOUNTER — Encounter (HOSPITAL_COMMUNITY): Payer: Self-pay | Admitting: *Deleted

## 2015-02-09 DIAGNOSIS — N509 Disorder of male genital organs, unspecified: Secondary | ICD-10-CM

## 2015-02-09 DIAGNOSIS — N5089 Other specified disorders of the male genital organs: Secondary | ICD-10-CM

## 2015-02-09 DIAGNOSIS — N508 Other specified disorders of male genital organs: Secondary | ICD-10-CM | POA: Insufficient documentation

## 2015-02-09 DIAGNOSIS — Z720 Tobacco use: Secondary | ICD-10-CM | POA: Insufficient documentation

## 2015-02-09 DIAGNOSIS — Z79899 Other long term (current) drug therapy: Secondary | ICD-10-CM | POA: Insufficient documentation

## 2015-02-09 NOTE — ED Provider Notes (Signed)
CSN: [id number]     Arrival date & time 02/09/15  0808 History   First MD Initiated Contact with Patient 02/09/15 0815     Chief Complaint  Patient presents with  . Testicle Mass      (Consider location/radiation/quality/duration/timing/severity/associated sxs/prior Treatment) HPI  34 year old male presents with a bump/mass noted on his right testicle. He first found this when he was in the shower about 5 days ago. It has not grown in size since then. He states it is a little tender when he touches it but otherwise does not notice any pain at rest. No dysuria, discharge, or fevers. Patient denies any penile pain. No abdominal pain or vomiting. Patient is in a monogamous relationship for several years and has very low suspicion for STI. At its maximum he rates his pain as a 2/10.  Past Medical History  Diagnosis Date  . Medical history non-contributory    Past Surgical History  Procedure Laterality Date  . No past surgeries     History reviewed. No pertinent family history. Social History  Substance Use Topics  . Smoking status: Current Some Day Smoker -- 0.25 packs/day    Types: Cigarettes  . Smokeless tobacco: None  . Alcohol Use: 13.2 oz/week    12 Cans of beer, 10 Shots of liquor per week     Comment: several times weekly    Review of Systems  Constitutional: Negative for fever.  Gastrointestinal: Negative for vomiting and abdominal pain.  Genitourinary: Positive for scrotal swelling and testicular pain. Negative for dysuria, discharge, penile swelling and penile pain.  All other systems reviewed and are negative.     Allergies  Review of patient's allergies indicates no known allergies.  Home Medications   Prior to Admission medications   Medication Sig Start Date End Date Taking? Authorizing Provider  acetaminophen (TYLENOL) 500 MG tablet Take 1,000 mg by mouth every 6 (six) hours as needed for headache.   Yes Historical Provider, MD  ranitidine (ZANTAC) 150 MG  tablet Take 150 mg by mouth 2 (two) times daily.   Yes Historical Provider, MD  cephALEXin (KEFLEX) 500 MG capsule Take 1 capsule (500 mg total) by mouth 3 (three) times daily. Patient not taking: Reported on 02/09/2015 01/20/15   Tatyana Kirichenko, PA-C  predniSONE (DELTASONE) 50 MG tablet Take 1 tablet (50 mg total) by mouth daily. Patient not taking: Reported on 02/09/2015 01/20/15   Tatyana Kirichenko, PA-C  traMADol (ULTRAM) 50 MG tablet Take 1 tablet (50 mg total) by mouth every 6 (six) hours as needed. Patient not taking: Reported on 02/09/2015 10/03/14   Tilden Fossa, MD   BP 145/98 mmHg  Pulse 87  Temp(Src) 97.5 F (36.4 C) (Oral)  Resp 16  SpO2 100% Physical Exam  Constitutional: He is oriented to person, place, and time. He appears well-developed and well-nourished.  HENT:  Head: Normocephalic and atraumatic.  Right Ear: External ear normal.  Left Ear: External ear normal.  Nose: Nose normal.  Eyes: Right eye exhibits no discharge. Left eye exhibits no discharge.  Neck: Neck supple.  Pulmonary/Chest: Effort normal.  Abdominal: Soft. He exhibits no distension. There is no tenderness.  Genitourinary: Penis normal.    Right testis shows mass. Right testis shows no swelling and no tenderness. Right testis is descended. Left testis shows no mass, no swelling and no tenderness. Left testis is descended. No penile tenderness. No discharge found.  Neurological: He is alert and oriented to person, place, and time.  Skin: Skin is  warm and dry.  Nursing note and vitals reviewed.   ED Course  Procedures (including critical care time) Labs Review Labs Reviewed  GC/CHLAMYDIA PROBE AMP ([place]) NOT AT [facility]    Imaging Review US Scrotum  02/09/2015   CLINICAL DATA:  Right testicular mass with tenderness for 5 days.  EXAM: SCROTAL ULTRASOUND  DOPPLER ULTRASOUND OF THE TESTICLES  TECHNIQUE: Complete ultrasound examination of the testicles, epididymis, and other scrotal structures  was performed. Color and spectral Doppler ultrasound were also utilized to evaluate blood flow to the testicles.  COMPARISON:  None.  FINDINGS: Right testicle  Measurements: 4.5 x 2.6 x 2.9 cm. No testicular mass or microlithiasis visualized. There is an extratesticular lesion near the epididymal tail. This is a well-defined homogeneous hyperechoic structure that measures 1.1 x 0.7 x 0.9 cm. There is a small amount of flow within or around this lesion.  Left testicle  Measurements: 5.0 x 2.5 x 2.8 cm. No mass or microlithiasis visualized.  Right epididymis:  Normal in size and appearance.  Left epididymis:  Normal in size and appearance.  Hydrocele:  None visualized.  Varicocele:  None visualized.  Pulsed Doppler interrogation of both testes demonstrates normal low resistance arterial and venous waveforms bilaterally.  IMPRESSION: Negative for testicular torsion.  There is a 1.1 cm extratesticular lesion near the epididymal tail. Differential diagnosis would include an adenomatoid tumor, peritesticular lipoma and fibrous pseudotumor of the scrotum. A focal hematoma is also in the differential diagnosis but probably unlikely if there is truly vascular flow in this lesion. Consider a 3-4 week follow-up ultrasound to evaluate for stability and non emergent urology consultation.   Electronically Signed   By: Richarda Overlie M.D.   On: 02/09/2015 10:05   [place] Art/ven Flow Abd Pelv Doppler  02/09/2015   CLINICAL DATA:  Right testicular mass with tenderness for 5 days.  EXAM: SCROTAL ULTRASOUND  DOPPLER ULTRASOUND OF THE TESTICLES  TECHNIQUE: Complete ultrasound examination of the testicles, epididymis, and other scrotal structures was performed. Color and spectral Doppler ultrasound were also utilized to evaluate blood flow to the testicles.  COMPARISON:  None.  FINDINGS: Right testicle  Measurements: 4.5 x 2.6 x 2.9 cm. No testicular mass or microlithiasis visualized. There is an extratesticular lesion near the epididymal  tail. This is a well-defined homogeneous hyperechoic structure that measures 1.1 x 0.7 x 0.9 cm. There is a small amount of flow within or around this lesion.  Left testicle  Measurements: 5.0 x 2.5 x 2.8 cm. No mass or microlithiasis visualized.  Right epididymis:  Normal in size and appearance.  Left epididymis:  Normal in size and appearance.  Hydrocele:  None visualized.  Varicocele:  None visualized.  Pulsed Doppler interrogation of both testes demonstrates normal low resistance arterial and venous waveforms bilaterally.  IMPRESSION: Negative for testicular torsion.  There is a 1.1 cm extratesticular lesion near the epididymal tail. Differential diagnosis would include an adenomatoid tumor, peritesticular lipoma and fibrous pseudotumor of the scrotum. A focal hematoma is also in the differential diagnosis but probably unlikely if there is truly vascular flow in this lesion. Consider a 3-4 week follow-up ultrasound to evaluate for stability and non emergent urology consultation.   Electronically Signed   By: Richarda Overlie M.D.   On: 02/09/2015 10:05   I have personally reviewed and evaluated these images and lab results as part of my medical decision-making.   EKG Interpretation None      MDM   Final diagnoses:  Testicular lesion    Patient with a small extra-testicular lesion as above. No significant pain. Discussed patient and ultrasound results with Dr. Retta Diones, who recommends 1-2 week f/u in urology clinic and repeat ultrasound. Low likelihood of cancer per him. Discussed return precautions with patient. Unlikely to be infectious in etiology.    Pricilla Loveless, MD 02/09/15 1026

## 2015-02-09 NOTE — ED Notes (Signed)
MD at bedside. 

## 2015-02-09 NOTE — ED Notes (Addendum)
Patient transported to Ultrasound 

## 2015-02-09 NOTE — Discharge Instructions (Signed)
There is a 1 cm "lesion" outside of your testicle. It is most likely an adenoma but you need close outpatient follow up. Call the number for the urologists to see them in 1-2 weeks. You will need a repeat ultrasound of your scrotum as well. If you notice any concerning changes such as increasing pain or swelling return to ER or call the urologist.

## 2015-02-09 NOTE — ED Notes (Addendum)
Pt reports he was showering on Sun/Mon and noticed a bump on inside right testicle, tenderness with palpation. Pain 2/10. In monogamous relationship. Denies penile discharge. Denies dysuria.   Internal small bump/mass felt near center scrotal line. No break in skin.  Reports no family hx of prostate or bladder cancer that pt knows of. Brother died of pancreatic cancer at 33 years old.

## 2015-02-12 LAB — GC/CHLAMYDIA PROBE AMP (~~LOC~~) NOT AT ARMC
Chlamydia: NEGATIVE
Neisseria Gonorrhea: NEGATIVE

## 2016-05-15 IMAGING — US US ART/VEN ABD/PELV/SCROTUM DOPPLER LTD
1 series · 13 of 25 positions shown · non-contrast
Comparison: None.

CLINICAL DATA: Right testicular mass with tenderness for 5 days.

EXAM:
SCROTAL ULTRASOUND
DOPPLER ULTRASOUND OF THE TESTICLES
TECHNIQUE: Complete ultrasound examination of the testicles, epididymis, and
other scrotal structures was performed. Color and spectral Doppler
ultrasound were also utilized to evaluate blood flow to the
testicles.

[Series 1: us art/ven abd/pelv/scrotum doppler ltd · 0.06mm/px · 13 of 74 slices shown]
[im 1/74]
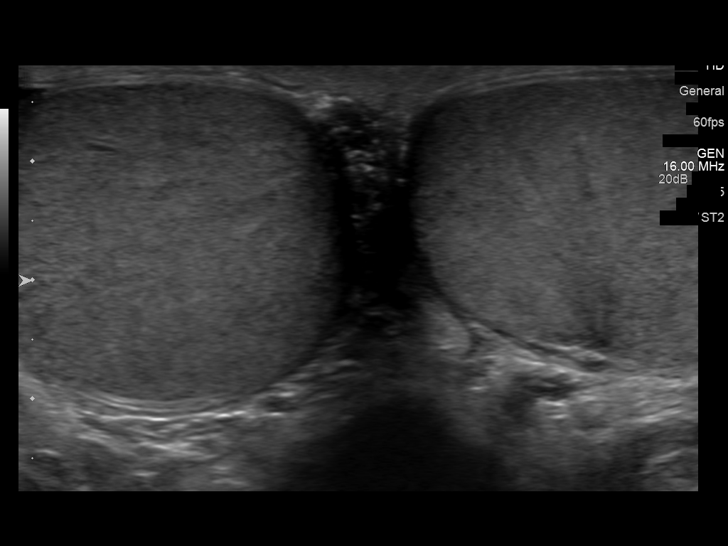
[im 7/74]
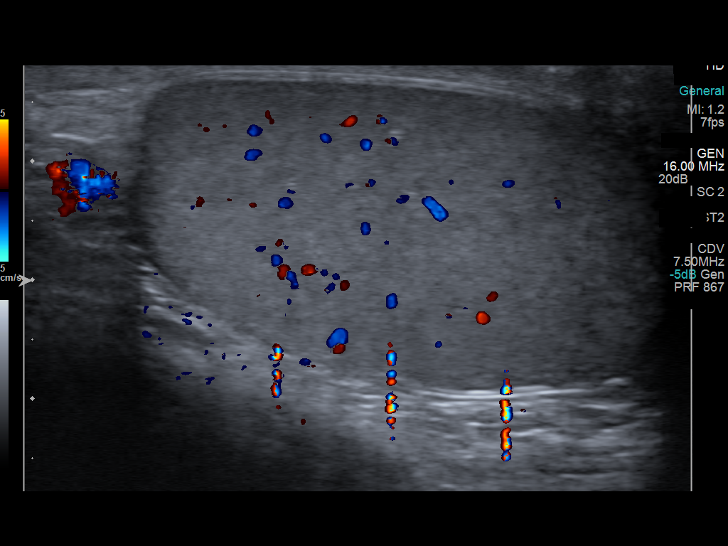
[im 13/74]
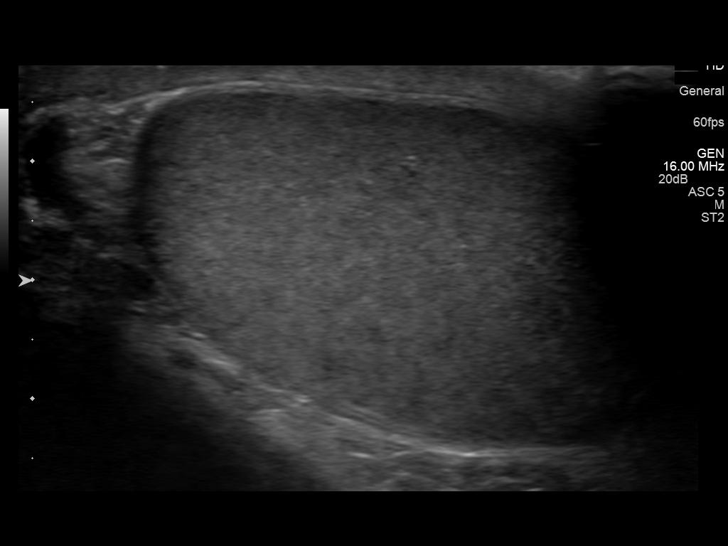
[im 19/74]
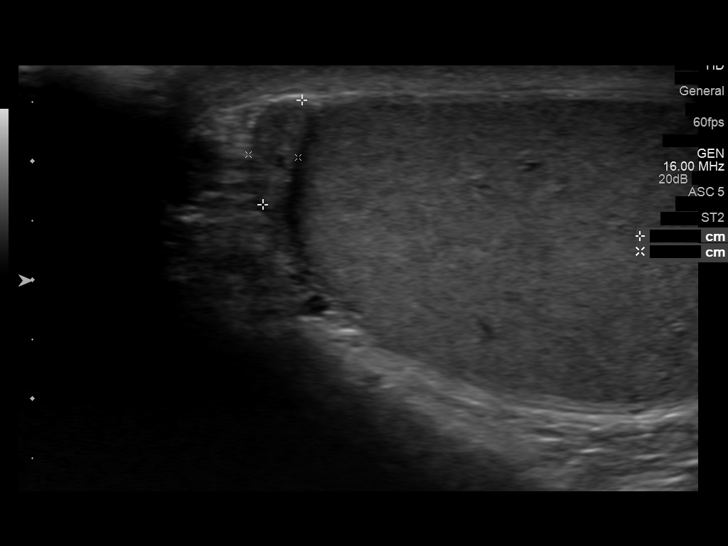
[im 25/74]
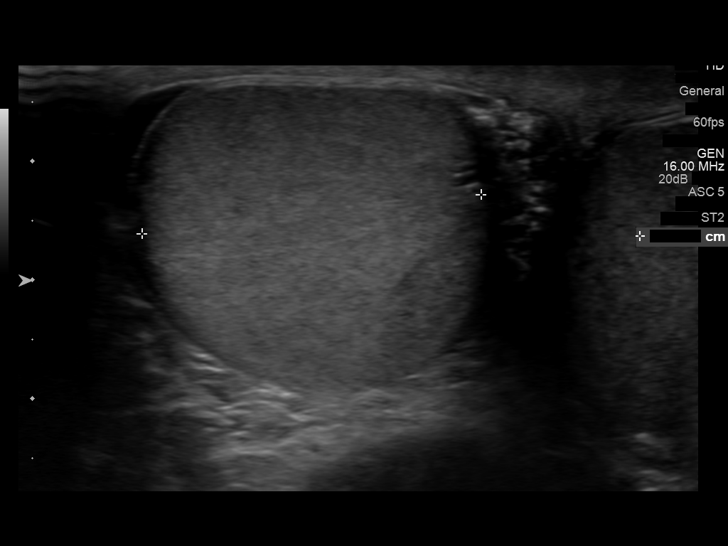
[im 31/74]
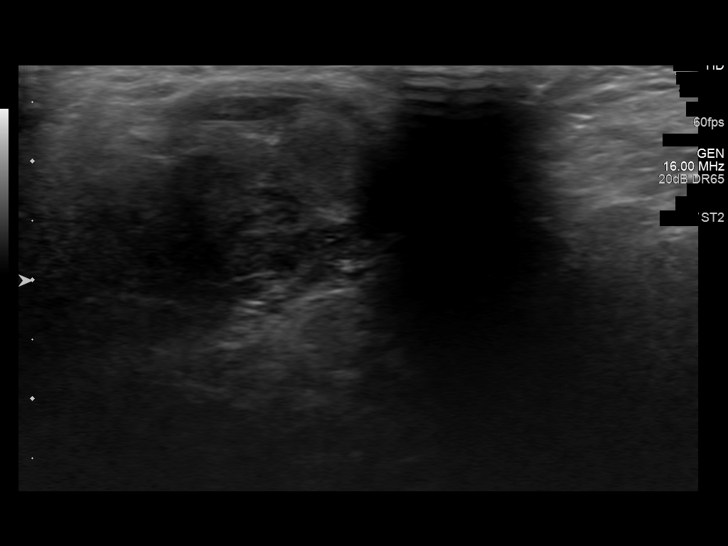
[im 37/74]
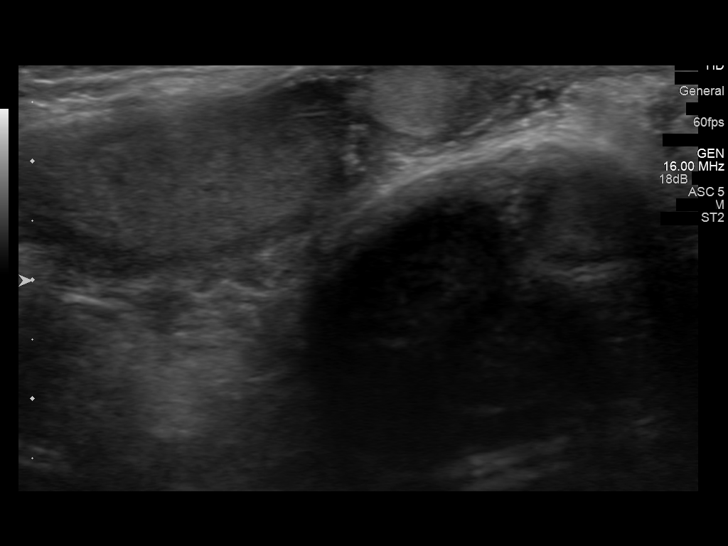
[im 43/74]
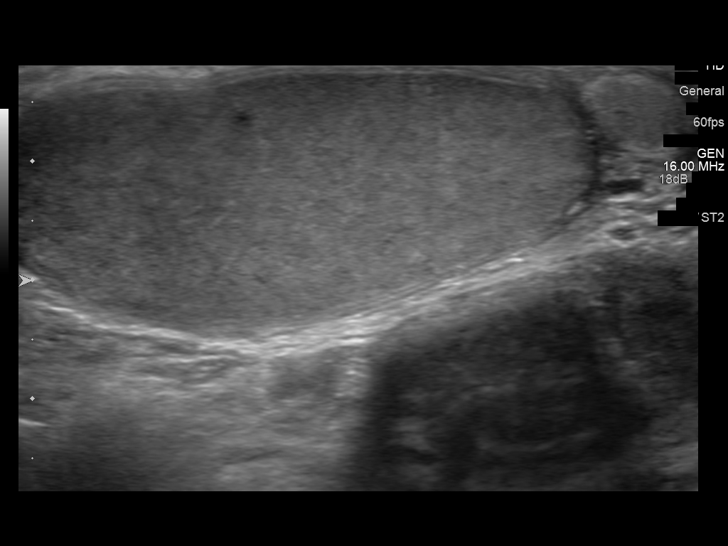
[im 49/74]
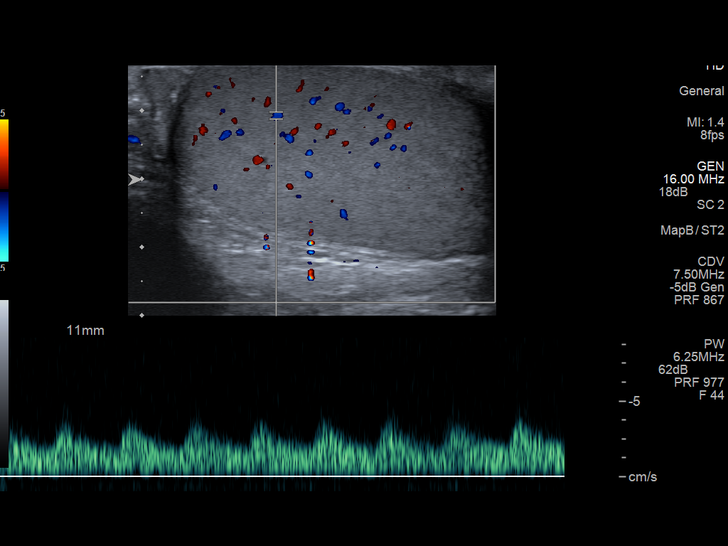
[im 55/74]
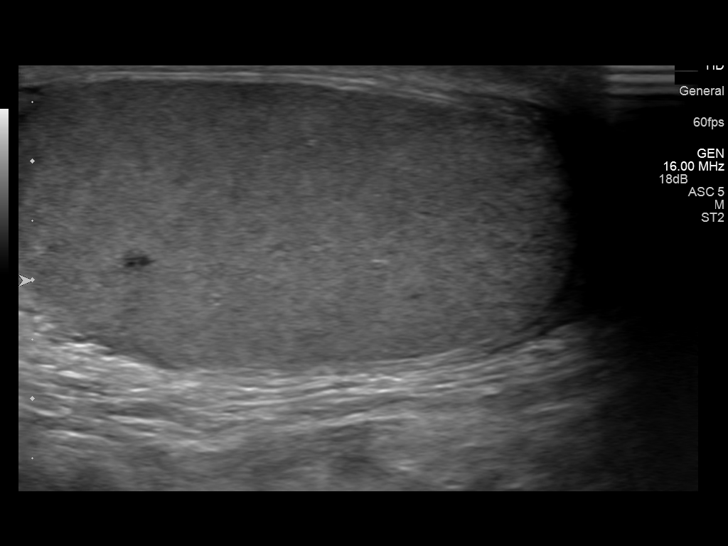
[im 61/74]
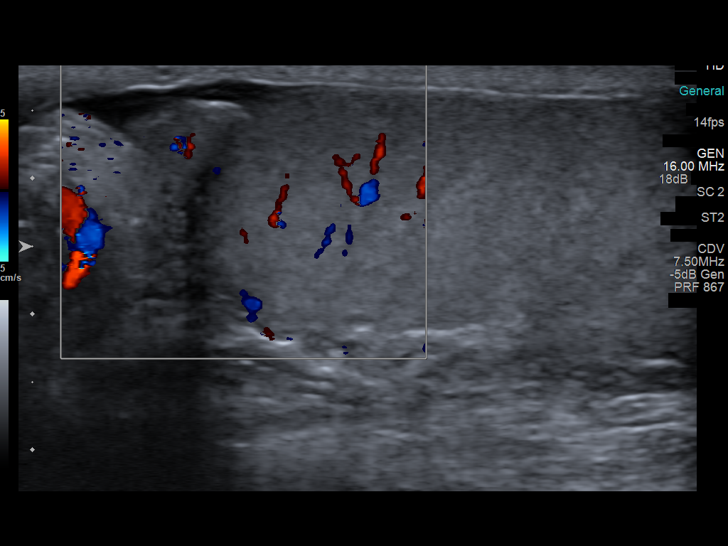
[im 67/74]
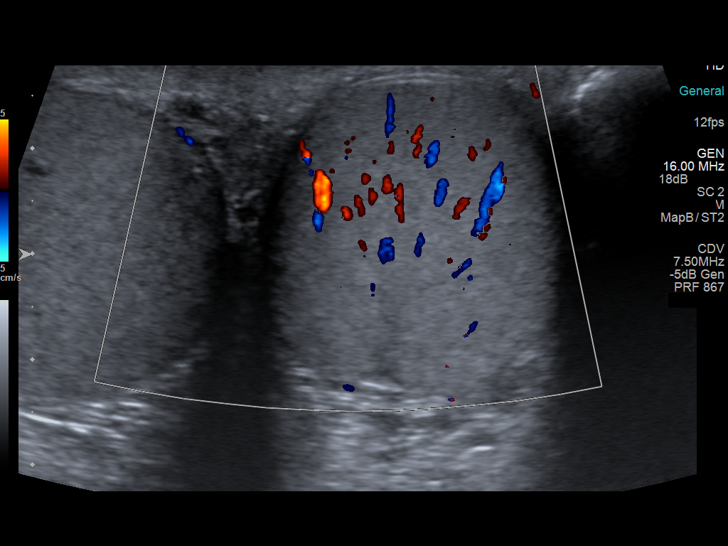
[im 74/74]
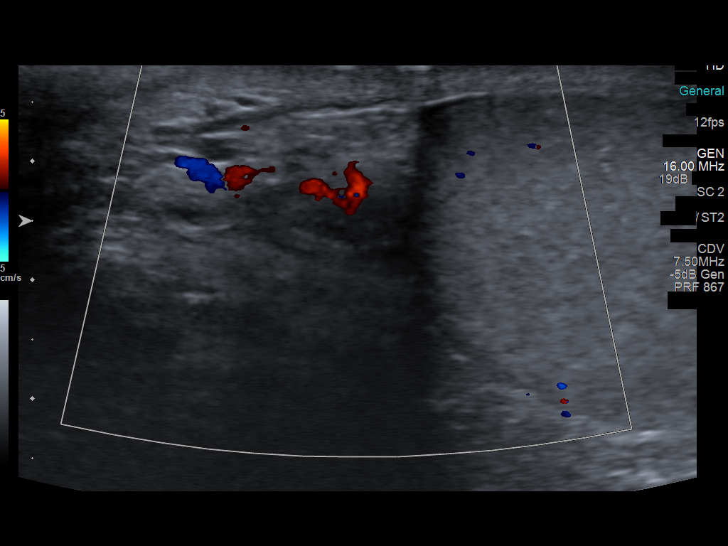

[13 of 25 positions shown; findings below may reference images not displayed]

FINDINGS: Right testicle

Measurements: 4.5 x 2.6 x 2.9 cm. No testicular mass or
microlithiasis visualized. There is an extratesticular lesion near
the epididymal tail. This is a well-defined homogeneous hyperechoic
structure that measures 1.1 x 0.7 x 0.9 cm. There is a small amount
of flow within or around this lesion.

Left testicle

Measurements: 5.0 x 2.5 x 2.8 cm. No mass or microlithiasis
visualized.

Right epididymis:  Normal in size and appearance.

Left epididymis:  Normal in size and appearance.

Hydrocele:  None visualized.

Varicocele:  None visualized.

Pulsed Doppler interrogation of both testes demonstrates normal low
resistance arterial and venous waveforms bilaterally.
IMPRESSION: Negative for testicular torsion.

There is a 1.1 cm extratesticular lesion near the epididymal tail.
Differential diagnosis would include an adenomatoid tumor,
peritesticular lipoma and fibrous pseudotumor of the scrotum. A
focal hematoma is also in the differential diagnosis but probably
unlikely if there is truly vascular flow in this lesion. Consider a
3-4 week follow-up ultrasound to evaluate for stability and non
emergent urology consultation.

## 2023-10-22 ENCOUNTER — Ambulatory Visit: Payer: Self-pay | Admitting: Physician Assistant

## 2023-10-29 ENCOUNTER — Ambulatory Visit: Admitting: Physician Assistant

## 2023-11-20 ENCOUNTER — Ambulatory Visit: Payer: Self-pay | Admitting: Family Medicine
